# Patient Record
Sex: Female | Born: 2000 | Race: White | Hispanic: Yes | Marital: Single | Smoking: Never smoker
Health system: Southern US, Community
[De-identification: ages and names within clinical notes are randomized; demographics above are authoritative.]

## PROBLEM LIST (undated history)

## (undated) ENCOUNTER — Inpatient Hospital Stay (HOSPITAL_COMMUNITY): Payer: Self-pay

## (undated) DIAGNOSIS — J302 Other seasonal allergic rhinitis: Secondary | ICD-10-CM

## (undated) HISTORY — PX: TONSILLECTOMY: SUR1361

---
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
## 2018-06-30 ENCOUNTER — Other Ambulatory Visit: Payer: Self-pay | Admitting: Nurse Practitioner

## 2018-06-30 DIAGNOSIS — R1031 Right lower quadrant pain: Secondary | ICD-10-CM

## 2018-06-30 DIAGNOSIS — N946 Dysmenorrhea, unspecified: Secondary | ICD-10-CM

## 2020-11-18 ENCOUNTER — Ambulatory Visit
Admission: EM | Admit: 2020-11-18 | Discharge: 2020-11-18 | Disposition: A | Discharge status: Home / Self Care | Payer: Medicaid Other | Attending: Emergency Medicine | Admitting: Emergency Medicine

## 2020-11-18 DIAGNOSIS — R1030 Lower abdominal pain, unspecified: Secondary | ICD-10-CM

## 2020-11-18 DIAGNOSIS — M62838 Other muscle spasm: Secondary | ICD-10-CM

## 2020-11-18 MED ORDER — NAPROXEN 500 MG PO TABS
500.0000 mg | ORAL_TABLET | Freq: Two times a day (BID) | ORAL | 0 refills | Status: DC
Start: 2020-11-18 — End: 2024-08-09

## 2020-11-18 MED ORDER — TIZANIDINE HCL 2 MG PO TABS
2.0000 mg | ORAL_TABLET | Freq: Four times a day (QID) | ORAL | 0 refills | Status: DC | PRN
Start: 2020-11-18 — End: 2024-08-09

## 2020-11-18 NOTE — ED Triage Notes (Signed)
Pt c/o acute LLQ abdominal pain that radiates through left groin, down left leg, mild nausea and left face "twitching" for approx 3 days. States LLQ pain increases with "certain movements" Denies dysuria, fever, v/d, vaginal discharge/odor, URI symptoms.  Last BM was this morning WNL for pt.  Has IUD in place.  No OTC medications for symptoms.

## 2020-11-18 NOTE — Discharge Instructions (Signed)
Naprosyn twice daily for pain Supplement with tizanidine as needed for spasming-may cause drowsiness, do not drive or work after taking  Please continue monitor symptoms, follow-up if not improving or worsening

## 2020-11-18 NOTE — ED Provider Notes (Signed)
[facility]    CSN: [id number] Arrival date & time: 11/18/20  1219      History   Chief Complaint Chief Complaint  Patient presents with   Abdominal Pain    HPI Maureen Baxter is a 19 y.o. female presenting today for evaluation of abdominal pain and nausea.  Patient reports that she has had symptoms for approximately 3 days. Shocking sensation in lower abdomen,  Into anterior thigh. Left tingling sensation in face. Reports associated nausea. Has copper IUD in place. Mild weird cramping. Normal oral intake, Normal bowel movements.   HPI  Past Medical History:  Diagnosis Date   Seasonal allergies     There are no problems to display for this patient.   Past Surgical History:  Procedure Laterality Date   TONSILLECTOMY      OB History   No obstetric history on file.      Home Medications    Prior to Admission medications   Medication Sig Start Date End Date Taking? Authorizing Provider  acetaminophen (TYLENOL) 500 MG tablet Take 500 mg by mouth every 6 (six) hours as needed. For pain    [provider]  naproxen (NAPROSYN) 500 MG tablet Take 1 tablet (500 mg total) by mouth 2 (two) times daily. 11/18/20   Aliyah Abeyta C, PA-C  tiZANidine (ZANAFLEX) 2 MG tablet Take 1-2 tablets (2-4 mg total) by mouth every 6 (six) hours as needed for muscle spasms. 11/18/20   Kaito Schulenburg, Junius Creamer, PA-C    Family History Family History  Problem Relation Age of Onset   Healthy Father     Social History Social History   Tobacco Use   Smoking status: Never Smoker   Smokeless tobacco: Never Used  Building services engineer Use: Never used  Substance Use Topics   Alcohol use: Yes   Drug use: Yes    Types: Marijuana     Allergies   Patient has no known allergies.   Review of Systems Review of Systems  Constitutional: Negative for activity change, appetite change, chills, fatigue and fever.  HENT: Negative for congestion, ear pain, rhinorrhea,  sinus pressure, sore throat and trouble swallowing.   Eyes: Negative for discharge and redness.  Respiratory: Negative for cough, chest tightness and shortness of breath.   Cardiovascular: Negative for chest pain.  Gastrointestinal: Positive for abdominal pain and nausea. Negative for diarrhea and vomiting.  Genitourinary: Negative for dysuria, flank pain, genital sores, hematuria, menstrual problem, vaginal bleeding, vaginal discharge and vaginal pain.  Musculoskeletal: Positive for myalgias. Negative for back pain.  Skin: Negative for rash.  Neurological: Negative for dizziness, light-headedness and headaches.     Physical Exam Triage Vital Signs ED Triage Vitals  Enc Vitals Group     BP      Pulse      Resp      Temp      Temp src      SpO2      Weight      Height      Head Circumference      Peak Flow      Pain Score      Pain Loc      Pain Edu?      Excl. in GC?    No data found.  Updated Vital Signs BP (!) 133/91 (BP Location: Left Arm)    Pulse 87    Temp 98.5 F (36.9 C) (Oral)    Resp 16    LMP  11/10/2020 (Approximate)    SpO2 100%   Visual Acuity Right Eye Distance:   Left Eye Distance:   Bilateral Distance:    Right Eye Near:   Left Eye Near:    Bilateral Near:     Physical Exam Vitals and nursing note reviewed.  Constitutional:      Appearance: She is well-developed and well-nourished.     Comments: No acute distress  HENT:     Head: Normocephalic and atraumatic.     Nose: Nose normal.  Eyes:     Conjunctiva/sclera: Conjunctivae normal.  Cardiovascular:     Rate and Rhythm: Normal rate.  Pulmonary:     Effort: Pulmonary effort is normal. No respiratory distress.  Abdominal:     General: There is no distension.     Comments: Soft, nondistended, tender to palpation to bilateral lower quadrants, most prominent to suprapubic area and left lower quadrant  Genitourinary:    Comments: Vaginal mucosa pink, IUD strings visualized in cervical os,  mucousy discharge present around this Musculoskeletal:        General: Normal range of motion.     Cervical back: Neck supple.     Comments: Mild tenderness to palpation to left lower lumbar area, no midline tenderness Strength at hips and knees 5/5 ankle bilaterally, patellar reflex 2+ bilaterally  Skin:    General: Skin is warm and dry.  Neurological:     Mental Status: She is alert and oriented to person, place, and time.  Psychiatric:        Mood and Affect: Mood and affect normal.      UC Treatments / Results  Labs (all labs ordered are listed, but only abnormal results are displayed) Labs Reviewed - No data to display  EKG   Radiology No results found.  Procedures Procedures (including critical care time)  Medications Ordered in UC Medications - No data to display  Initial Impression / Assessment and Plan / UC Course  I have reviewed the triage vital signs and the nursing notes.  Pertinent labs & imaging results that were available during my care of the patient were reviewed by me and considered in my medical decision making (see chart for details).     Treating with Naprosyn and tizanidine given reported spasming sensations as well as possible relation to lower back pain.  IUD strings in place.  Did discuss potential follow-up with OB/GYN if continuing to have some lower abdominal/pelvic discomfort to further evaluate for underlying cyst versus abnormal placement of IUD.  No neuro deficits, strength and reflexes intact.  Discussed strict return precautions. Patient verbalized understanding and is agreeable with plan.  Final Clinical Impressions(s) / UC Diagnoses   Final diagnoses:  Lower abdominal pain  Muscle spasm     Discharge Instructions     Naprosyn twice daily for pain Supplement with tizanidine as needed for spasming-may cause drowsiness, do not drive or work after taking  Please continue monitor symptoms, follow-up if not improving or  worsening    ED Prescriptions    Medication Sig Dispense Auth. Provider   naproxen (NAPROSYN) 500 MG tablet Take 1 tablet (500 mg total) by mouth 2 (two) times daily. 30 tablet Sanya Kobrin C, PA-C   tiZANidine (ZANAFLEX) 2 MG tablet Take 1-2 tablets (2-4 mg total) by mouth every 6 (six) hours as needed for muscle spasms. 24 tablet Aniza Shor, Minto C, PA-C     PDMP not reviewed this encounter.   Lew Dawes, [place] 11/18/20 1512

## 2021-05-30 DIAGNOSIS — L309 Dermatitis, unspecified: Secondary | ICD-10-CM | POA: Insufficient documentation

## 2021-05-30 DIAGNOSIS — B353 Tinea pedis: Secondary | ICD-10-CM | POA: Insufficient documentation

## 2022-02-02 DIAGNOSIS — F4323 Adjustment disorder with mixed anxiety and depressed mood: Secondary | ICD-10-CM | POA: Insufficient documentation

## 2022-02-02 DIAGNOSIS — N943 Premenstrual tension syndrome: Secondary | ICD-10-CM | POA: Insufficient documentation

## 2022-07-17 DIAGNOSIS — R87610 Atypical squamous cells of undetermined significance on cytologic smear of cervix (ASC-US): Secondary | ICD-10-CM | POA: Insufficient documentation

## 2023-07-11 DIAGNOSIS — E611 Iron deficiency: Secondary | ICD-10-CM | POA: Insufficient documentation

## 2023-08-03 ENCOUNTER — Ambulatory Visit
Admission: EM | Admit: 2023-08-03 | Discharge: 2023-08-03 | Disposition: A | Discharge status: Home / Self Care | Payer: Medicaid Other | Attending: Nurse Practitioner | Admitting: Nurse Practitioner

## 2023-08-03 ENCOUNTER — Encounter: Payer: Self-pay | Admitting: Emergency Medicine

## 2023-08-03 ENCOUNTER — Other Ambulatory Visit: Payer: Self-pay

## 2023-08-03 DIAGNOSIS — Z1152 Encounter for screening for COVID-19: Secondary | ICD-10-CM | POA: Diagnosis present

## 2023-08-03 DIAGNOSIS — R062 Wheezing: Secondary | ICD-10-CM | POA: Insufficient documentation

## 2023-08-03 DIAGNOSIS — J069 Acute upper respiratory infection, unspecified: Secondary | ICD-10-CM | POA: Diagnosis present

## 2023-08-03 MED ORDER — ALBUTEROL SULFATE HFA 108 (90 BASE) MCG/ACT IN AERS: 1.0000 | INHALATION_SPRAY | Freq: Four times a day (QID) | RESPIRATORY_TRACT | 0 refills | Status: AC | PRN

## 2023-08-03 MED ORDER — BENZONATATE 100 MG PO CAPS
100.0000 mg | ORAL_CAPSULE | Freq: Three times a day (TID) | ORAL | 0 refills | Status: DC | PRN
Start: 2023-08-03 — End: 2024-08-09

## 2023-08-03 MED ORDER — PROMETHAZINE-DM 6.25-15 MG/5ML PO SYRP
5.0000 mL | ORAL_SOLUTION | Freq: Every evening | ORAL | 0 refills | Status: DC | PRN
Start: 2023-08-03 — End: 2024-08-09

## 2023-08-03 NOTE — Discharge Instructions (Signed)
You have a viral upper respiratory infection.  Symptoms should improve over the next week to 10 days.  If you develop chest pain or shortness of breath, go to the emergency room.  We have tested you today for COVID-19.  You will see the results in Mychart and we will contact you with positive results.  Please stay home and isolate until you are fever free without fever reducing medication for 24 hours.  You can use your albuterol inhaler every 4-6 hours as needed for wheezing or shortness of breath..    Some things that can make you feel better are: - Increased rest - Increasing fluid with water/sugar free electrolytes - Acetaminophen and ibuprofen as needed for fever/pain - Salt water gargling, chloraseptic spray and throat lozenges - OTC guaifenesin (Mucinex) 600 mg twice daily - Saline sinus flushes or a neti pot - Humidifying the air -Tessalon Perles every 8 hours as needed for dry cough and cough syrup at night time for dry cough

## 2023-08-03 NOTE — ED Notes (Signed)
Pt did not answer x 1 

## 2023-08-03 NOTE — ED Provider Notes (Signed)
[facility]    CSN: [id number] Arrival date & time: 08/03/23  7829      History   Chief Complaint Chief Complaint  Patient presents with   Nasal Congestion   Generalized Body Aches    HPI Maureen Baxter is a 22 y.o. female.   Patient presents today for 2-day history of bodyaches, chills, congested cough, wheezing, runny and stuffy nose, sore throat, headache, decreased appetite, and fatigue.  No fever, dry cough, chest tightness, ear pain, abdominal pain, nausea/vomiting, or diarrhea.  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  Has tried over-the-counter cold/flu medication without relief.  Reports she woke up this morning feeling much more fatigued.  Reports history of asthma, used to use rescue inhaler and does not have anymore.  Reports she no longer has IUD, LMP 07/13/2023.    Past Medical History:  Diagnosis Date   Seasonal allergies     There are no problems to display for this patient.   Past Surgical History:  Procedure Laterality Date   TONSILLECTOMY      OB History   No obstetric history on file.      Home Medications    Prior to Admission medications   Medication Sig Start Date End Date Taking? Authorizing Provider  albuterol (VENTOLIN HFA) 108 (90 Base) MCG/ACT inhaler Inhale 1-2 puffs into the lungs every 6 (six) hours as needed for wheezing or shortness of breath. 08/03/23  Yes Valentino Nose, NP  benzonatate (TESSALON) 100 MG capsule Take 1 capsule (100 mg total) by mouth 3 (three) times daily as needed for cough. Do not take with alcohol or while driving or operating heavy machinery.  May cause drowsiness. 08/03/23  Yes Valentino Nose, NP  promethazine-dextromethorphan (PROMETHAZINE-DM) 6.25-15 MG/5ML syrup Take 5 mLs by mouth at bedtime as needed for cough. Do not take with alcohol or while driving or operating heavy machinery.  May cause drowsiness. 08/03/23  Yes Valentino Nose, NP  acetaminophen  (TYLENOL) 500 MG tablet Take 500 mg by mouth every 6 (six) hours as needed. For pain    [provider]  naproxen (NAPROSYN) 500 MG tablet Take 1 tablet (500 mg total) by mouth 2 (two) times daily. 11/18/20   Wieters, Hallie C, PA-C  tiZANidine (ZANAFLEX) 2 MG tablet Take 1-2 tablets (2-4 mg total) by mouth every 6 (six) hours as needed for muscle spasms. 11/18/20   Wieters, Junius Creamer, PA-C    Family History Family History  Problem Relation Age of Onset   Healthy Father     Social History Social History   Tobacco Use   Smoking status: Never   Smokeless tobacco: Never  Vaping Use   Vaping status: Never Used  Substance Use Topics   Alcohol use: Yes   Drug use: Yes    Types: Marijuana     Allergies   Patient has no known allergies.   Review of Systems Review of Systems Per HPI  Physical Exam Triage Vital Signs ED Triage Vitals  Encounter Vitals Group     BP 08/03/23 1110 (!) 141/79     Systolic BP Percentile --      Diastolic BP Percentile --      Pulse Rate 08/03/23 1110 (!) 126     Resp 08/03/23 1110 18     Temp 08/03/23 1110 99.4 F (37.4 C)     Temp Source 08/03/23 1110 Oral     SpO2 08/03/23 1110 98 %  Weight --      Height --      Head Circumference --      Peak Flow --      Pain Score 08/03/23 1111 5     Pain Loc --      Pain Education --      Exclude from Growth Chart --    No data found.  Updated Vital Signs BP (!) 141/79 (BP Location: Left Arm)   Pulse (!) 126   Temp 99.4 F (37.4 C) (Oral)   Resp 18   SpO2 98%   Visual Acuity Right Eye Distance:   Left Eye Distance:   Bilateral Distance:    Right Eye Near:   Left Eye Near:    Bilateral Near:     Physical Exam Vitals and nursing note reviewed.  Constitutional:      General: She is not in acute distress.    Appearance: Normal appearance. She is ill-appearing. She is not toxic-appearing.  HENT:     Head: Normocephalic and atraumatic.     Right Ear: Tympanic membrane,  ear canal and external ear normal.     Left Ear: Tympanic membrane, ear canal and external ear normal.     Nose: Congestion and rhinorrhea present.     Mouth/Throat:     Mouth: Mucous membranes are moist.     Pharynx: Oropharynx is clear. Posterior oropharyngeal erythema present. No oropharyngeal exudate.  Eyes:     General: No scleral icterus.    Extraocular Movements: Extraocular movements intact.  Cardiovascular:     Rate and Rhythm: Regular rhythm. Tachycardia present.  Pulmonary:     Effort: Pulmonary effort is normal. No respiratory distress.     Breath sounds: Normal breath sounds. No wheezing, rhonchi or rales.  Abdominal:     General: Abdomen is flat. Bowel sounds are normal. There is no distension.     Palpations: Abdomen is soft.  Musculoskeletal:     Cervical back: Normal range of motion and neck supple.  Lymphadenopathy:     Cervical: No cervical adenopathy.  Skin:    General: Skin is warm and dry.     Coloration: Skin is not jaundiced or pale.     Findings: No erythema or rash.  Neurological:     Mental Status: She is alert and oriented to person, place, and time.  Psychiatric:        Behavior: Behavior is cooperative.      UC Treatments / Results  Labs (all labs ordered are listed, but only abnormal results are displayed) Labs Reviewed  SARS CORONAVIRUS 2 (TAT 6-24 HRS)    EKG   Radiology No results found.  Procedures Procedures (including critical care time)  Medications Ordered in UC Medications - No data to display  Initial Impression / Assessment and Plan / UC Course  I have reviewed the triage vital signs and the nursing notes.  Pertinent labs & imaging results that were available during my care of the patient were reviewed by me and considered in my medical decision making (see chart for details).   Patient is well-appearing, normotensive, afebrile, not tachypneic, oxygenating well on room air.  Patient is tachycardic in triage, likely  secondary to viral illness and low-grade fever.  1. Viral URI with cough 2. Encounter for screening for COVID-19 3. Wheezing Suspect viral etiology COVID-19 testing obtained Overall, vital signs are stable and examination is reassuring Influenza testing deferred given length of symptoms Supportive care discussed with patient Start cough  medication ER and return precautions also discussed Work excuse given  The patient was given the opportunity to ask questions.  All questions answered to their satisfaction.  The patient is in agreement to this plan.    Final Clinical Impressions(s) / UC Diagnoses   Final diagnoses:  Viral URI with cough  Encounter for screening for COVID-19  Wheezing     Discharge Instructions      You have a viral upper respiratory infection.  Symptoms should improve over the next week to 10 days.  If you develop chest pain or shortness of breath, go to the emergency room.  We have tested you today for COVID-19.  You will see the results in Mychart and we will contact you with positive results.  Please stay home and isolate until you are fever free without fever reducing medication for 24 hours.  You can use your albuterol inhaler every 4-6 hours as needed for wheezing or shortness of breath..    Some things that can make you feel better are: - Increased rest - Increasing fluid with water/sugar free electrolytes - Acetaminophen and ibuprofen as needed for fever/pain - Salt water gargling, chloraseptic spray and throat lozenges - OTC guaifenesin (Mucinex) 600 mg twice daily - Saline sinus flushes or a neti pot - Humidifying the air -Tessalon Perles every 8 hours as needed for dry cough and cough syrup at night time for dry cough     ED Prescriptions     Medication Sig Dispense Auth. Provider   albuterol (VENTOLIN HFA) 108 (90 Base) MCG/ACT inhaler Inhale 1-2 puffs into the lungs every 6 (six) hours as needed for wheezing or shortness of breath. 18 g  Cathlean Marseilles A, NP   benzonatate (TESSALON) 100 MG capsule Take 1 capsule (100 mg total) by mouth 3 (three) times daily as needed for cough. Do not take with alcohol or while driving or operating heavy machinery.  May cause drowsiness. 30 capsule Cathlean Marseilles A, NP   promethazine-dextromethorphan (PROMETHAZINE-DM) 6.25-15 MG/5ML syrup Take 5 mLs by mouth at bedtime as needed for cough. Do not take with alcohol or while driving or operating heavy machinery.  May cause drowsiness. 118 mL Valentino Nose, NP      PDMP not reviewed this encounter.   Valentino Nose, NP 08/03/23 [phone]

## 2023-08-03 NOTE — ED Triage Notes (Signed)
Pt here for body aches, cough and congestion x 3 days

## 2023-08-04 LAB — SARS CORONAVIRUS 2 (TAT 6-24 HRS): SARS Coronavirus 2: POSITIVE — AB

## 2023-11-02 ENCOUNTER — Ambulatory Visit
Admission: EM | Admit: 2023-11-02 | Discharge: 2023-11-02 | Disposition: A | Discharge status: Home / Self Care | Payer: Medicaid Other

## 2023-11-02 DIAGNOSIS — J019 Acute sinusitis, unspecified: Secondary | ICD-10-CM | POA: Diagnosis not present

## 2023-11-02 MED ORDER — AMOXICILLIN-POT CLAVULANATE 875-125 MG PO TABS: 1.0000 | ORAL_TABLET | Freq: Two times a day (BID) | ORAL | 0 refills | Status: DC

## 2023-11-02 NOTE — ED Provider Notes (Signed)
[facility]    CSN: [id number] Arrival date & time: 11/02/23  1032      History   Chief Complaint Chief Complaint  Patient presents with   Sore Throat   Fatigue   Generalized Body Aches    HPI Maureen Baxter is a 22 y.o. female.   Patient here today for evaluation of headache, fatigue, sore throat, fever that started about a week ago.  She notes that symptoms have not improved but her mucus is now green in color.  She denies any nausea or vomiting.  She does not have cough.  She has taken over-the-counter medication without resolution.  The history is provided by the patient.  Sore Throat Pertinent negatives include no abdominal pain and no shortness of breath.    Past Medical History:  Diagnosis Date   Seasonal allergies     Patient Active Problem List   Diagnosis Date Noted   Iron deficiency 07/11/2023   ASCUS of cervix with negative high risk HPV 07/17/2022   Adjustment disorder with mixed anxiety and depressed mood 02/02/2022   Premenstrual syndrome 02/02/2022   Dermatitis 05/30/2021   Tinea pedis of both feet 05/30/2021   Depression 09/13/2017   Generalized anxiety disorder 09/13/2017   Palpitations 08/27/2017    Past Surgical History:  Procedure Laterality Date   TONSILLECTOMY      OB History   No obstetric history on file.      Home Medications    Prior to Admission medications   Medication Sig Start Date End Date Taking? Authorizing Provider  amoxicillin-clavulanate (AUGMENTIN) 875-125 MG tablet Take 1 tablet by mouth every 12 (twelve) hours. 11/02/23  Yes Tomi Bamberger, PA-C  BALZIVA 0.4-35 MG-MCG tablet Take 1 tablet by mouth daily.   Yes [provider]  dextromethorphan-guaiFENesin (MUCINEX DM) 30-600 MG 12hr tablet Take 1 tablet by mouth 2 (two) times daily.   Yes [provider]  drospirenone-ethinyl estradiol (YAZ) 3-0.02 MG tablet Take 1 tablet by mouth daily. 07/17/22  Yes [provider]   acetaminophen (TYLENOL) 500 MG tablet Take 500 mg by mouth every 6 (six) hours as needed. For pain    [provider]  albuterol (VENTOLIN HFA) 108 (90 Base) MCG/ACT inhaler Inhale 1-2 puffs into the lungs every 6 (six) hours as needed for wheezing or shortness of breath. 08/03/23   Valentino Nose, NP  benzonatate (TESSALON) 100 MG capsule Take 1 capsule (100 mg total) by mouth 3 (three) times daily as needed for cough. Do not take with alcohol or while driving or operating heavy machinery.  May cause drowsiness. 08/03/23   Valentino Nose, NP  escitalopram (LEXAPRO) 20 MG tablet Take 20 mg by mouth daily.    [provider]  naproxen (NAPROSYN) 500 MG tablet Take 1 tablet (500 mg total) by mouth 2 (two) times daily. 11/18/20   Wieters, Hallie C, PA-C  promethazine-dextromethorphan (PROMETHAZINE-DM) 6.25-15 MG/5ML syrup Take 5 mLs by mouth at bedtime as needed for cough. Do not take with alcohol or while driving or operating heavy machinery.  May cause drowsiness. 08/03/23   Valentino Nose, NP  tiZANidine (ZANAFLEX) 2 MG tablet Take 1-2 tablets (2-4 mg total) by mouth every 6 (six) hours as needed for muscle spasms. 11/18/20   Wieters, Junius Creamer, PA-C    Family History Family History  Problem Relation Age of Onset   Healthy Father     Social History Social History   Tobacco Use   Smoking status:  Never   Smokeless tobacco: Never  Vaping Use   Vaping status: Never Used  Substance Use Topics   Alcohol use: Yes    Comment: Occassionally.   Drug use: Not Currently    Types: Marijuana     Allergies   Latex   Review of Systems Review of Systems  Constitutional:  Negative for chills and fever.  HENT:  Positive for congestion, sinus pressure and sore throat. Negative for ear pain.   Eyes:  Negative for discharge and redness.  Respiratory:  Negative for cough, shortness of breath and wheezing.   Gastrointestinal:  Negative for abdominal pain, diarrhea, nausea  and vomiting.     Physical Exam Triage Vital Signs ED Triage Vitals  Encounter Vitals Group     BP 11/02/23 1046 105/71     Systolic BP Percentile --      Diastolic BP Percentile --      Pulse Rate 11/02/23 1046 89     Resp 11/02/23 1046 18     Temp 11/02/23 1046 98.5 F (36.9 C)     Temp Source 11/02/23 1046 Oral     SpO2 11/02/23 1046 100 %     Weight 11/02/23 1044 130 lb (59 kg)     Height 11/02/23 1044 5' (1.524 m)     Head Circumference --      Peak Flow --      Pain Score 11/02/23 1042 0     Pain Loc --      Pain Education --      Exclude from Growth Chart --    No data found.  Updated Vital Signs BP 105/71 (BP Location: Left Arm)   Pulse 89   Temp 98.5 F (36.9 C) (Oral)   Resp 18   Ht 5' (1.524 m)   Wt 130 lb (59 kg)   LMP  (LMP Unknown)   SpO2 100%   BMI 25.39 kg/m   Visual Acuity Right Eye Distance:   Left Eye Distance:   Bilateral Distance:    Right Eye Near:   Left Eye Near:    Bilateral Near:     Physical Exam Vitals and nursing note reviewed.  Constitutional:      General: She is not in acute distress.    Appearance: Normal appearance. She is not ill-appearing.  HENT:     Head: Normocephalic and atraumatic.     Nose: Congestion present.     Mouth/Throat:     Mouth: Mucous membranes are moist.     Pharynx: No oropharyngeal exudate or posterior oropharyngeal erythema.  Eyes:     Conjunctiva/sclera: Conjunctivae normal.  Cardiovascular:     Rate and Rhythm: Normal rate and regular rhythm.     Heart sounds: Normal heart sounds. No murmur heard. Pulmonary:     Effort: Pulmonary effort is normal. No respiratory distress.     Breath sounds: Normal breath sounds. No wheezing, rhonchi or rales.  Skin:    General: Skin is warm and dry.  Neurological:     Mental Status: She is alert.  Psychiatric:        Mood and Affect: Mood normal.        Thought Content: Thought content normal.      UC Treatments / Results  Labs (all labs ordered  are listed, but only abnormal results are displayed) Labs Reviewed - No data to display  EKG   Radiology No results found.  Procedures Procedures (including critical care time)  Medications Ordered in  UC Medications - No data to display  Initial Impression / Assessment and Plan / UC Course  I have reviewed the triage vital signs and the nursing notes.  Pertinent labs & imaging results that were available during my care of the patient were reviewed by me and considered in my medical decision making (see chart for details).    Will treat to cover sinusitis with Augmentin.  Recommended follow-up if no gradual improvement or with any further concerns.  Final Clinical Impressions(s) / UC Diagnoses   Final diagnoses:  Acute sinusitis, recurrence not specified, unspecified location   Discharge Instructions   None    ED Prescriptions     Medication Sig Dispense Auth. Provider   amoxicillin-clavulanate (AUGMENTIN) 875-125 MG tablet Take 1 tablet by mouth every 12 (twelve) hours. 14 tablet Tomi Bamberger, PA-C      PDMP not reviewed this encounter.   Tomi Bamberger, PA-C 11/02/23 1331

## 2023-11-02 NOTE — ED Triage Notes (Signed)
"  This started with headache, fatigue, sore throat and fever blister exactly one week ago". "This is unchanged now with congestion, green mucous". No fever known "maybe the 1st day". No nausea. No vomiting. No new/unexplained rash. No Cough, No sob.

## 2024-08-08 DIAGNOSIS — F1220 Cannabis dependence, uncomplicated: Secondary | ICD-10-CM | POA: Diagnosis not present

## 2024-08-08 DIAGNOSIS — F332 Major depressive disorder, recurrent severe without psychotic features: Secondary | ICD-10-CM | POA: Diagnosis not present

## 2024-08-08 DIAGNOSIS — F4310 Post-traumatic stress disorder, unspecified: Secondary | ICD-10-CM | POA: Diagnosis not present

## 2024-08-08 DIAGNOSIS — F1020 Alcohol dependence, uncomplicated: Secondary | ICD-10-CM | POA: Diagnosis not present

## 2024-08-09 ENCOUNTER — Ambulatory Visit
Admission: EM | Admit: 2024-08-09 | Discharge: 2024-08-09 | Disposition: A | Discharge status: Home / Self Care | Attending: Nurse Practitioner | Admitting: Nurse Practitioner

## 2024-08-09 ENCOUNTER — Encounter: Payer: Self-pay | Admitting: Emergency Medicine

## 2024-08-09 DIAGNOSIS — J069 Acute upper respiratory infection, unspecified: Secondary | ICD-10-CM

## 2024-08-09 LAB — POC SARS CORONAVIRUS 2 AG -  ED: SARS Coronavirus 2 Ag: NEGATIVE

## 2024-08-09 NOTE — Discharge Instructions (Addendum)
 Your symptoms are most likely caused by a respiratory infection, which affects areas like your nose, throat, or lungs. This type of infection is usually caused by a virus.  Since your illness is caused by a virus, antibiotics won't help because they only treat infections caused by bacteria.  Continue the medications that you have been taking for symptom relief.  If you have a fever, headache, or body aches, you can also take Tylenol or ibuprofen to help you feel more comfortable. Be sure to drink plenty of fluids to stay hydrated--aim for enough to keep your urine a pale yellow color. This will also help to thin mucus and make it easier to clear from your body.   Using a cool mist humidifier at home to keep humidity levels above 50% can be helpful. You can also inhale steam for 10 to 15 minutes, 3 to 4 times a day. This can be done by sitting in the bathroom with a hot shower running, or by using over-the-counter vapor shower tablets to help with nasal congestion. Try to avoid cool or dry air as much as possible. When you sleep, keep your head elevated to help reduce post-nasal drainage. Be sure to get enough rest every night to support your recovery. Don't forget to replace your toothbrush once you start feeling better.   It's normal for a cough to linger for several weeks after a respiratory illness, even after other symptoms have resolved. This happens because the airways remain irritated and take time to fully heal. As long as the cough gradually improves and there are no new concerning symptoms, this is part of the normal recovery process.  If your symptoms get worse or if you develop any new or concerning symptoms, go to the emergency room right away. If you're not feeling better in a few days, follow up with your primary care provider.

## 2024-08-09 NOTE — ED Triage Notes (Signed)
 Pt st's she started feeling bad 4 days ago with generalized body aches, fever, productive cough (yellow) and vomiting yesterday

## 2024-08-09 NOTE — ED Provider Notes (Signed)
 [facility]    CSN: [id number] Arrival date & time: 08/09/24  1431      History   Chief Complaint Chief Complaint  Patient presents with   Generalized Body Aches    HPI Maureen Baxter is a 23 y.o. female.   Discussed the use of AI scribe software for clinical note transcription with the patient, who gave verbal consent to proceed.   The patient presents with multiple symptoms including body aches, nasal congestion, sneezing, cough, sore throat, runny nose, chills, vomiting, and headaches that started about 4 days ago. The patient denies fever, diarrhea, shortness of breath, or wheezing.   The patient has been taking over-the-counter medication containing pain relief, expectorant, and antihistamine components, which has provided some relief. The patient reports no known exposure to sick individuals. COVID tests performed yesterday at home were negative.   The following sections of the patient's history were reviewed and updated as appropriate: allergies, current medications, past family history, past medical history, past social history, past surgical history, and problem list.        Past Medical History:  Diagnosis Date   Seasonal allergies     Patient Active Problem List   Diagnosis Date Noted   Iron deficiency 07/11/2023   ASCUS of cervix with negative high risk HPV 07/17/2022   Adjustment disorder with mixed anxiety and depressed mood 02/02/2022   Premenstrual syndrome 02/02/2022   Dermatitis 05/30/2021   Tinea pedis of both feet 05/30/2021   Depression 09/13/2017   Generalized anxiety disorder 09/13/2017   Palpitations 08/27/2017    Past Surgical History:  Procedure Laterality Date   TONSILLECTOMY      OB History   No obstetric history on file.      Home Medications    Prior to Admission medications   Medication Sig Start Date End Date Taking? Authorizing Provider  acetaminophen (TYLENOL) 500 MG tablet Take 500 mg by mouth every 6  (six) hours as needed. For pain    [provider]  albuterol  (VENTOLIN  HFA) 108 (90 Base) MCG/ACT inhaler Inhale 1-2 puffs into the lungs every 6 (six) hours as needed for wheezing or shortness of breath. 08/03/23   Chandra Harlene LABOR, NP  BALZIVA 0.4-35 MG-MCG tablet Take 1 tablet by mouth daily.    [provider]  drospirenone-ethinyl estradiol (YAZ) 3-0.02 MG tablet Take 1 tablet by mouth daily. 07/17/22   [provider]  escitalopram (LEXAPRO) 20 MG tablet Take 20 mg by mouth daily.    [provider]    Family History Family History  Problem Relation Age of Onset   Healthy Father     Social History Social History   Tobacco Use   Smoking status: Never   Smokeless tobacco: Never  Vaping Use   Vaping status: Never Used  Substance Use Topics   Alcohol use: Yes    Comment: Occassionally.   Drug use: Not Currently    Types: Marijuana     Allergies   Latex   Review of Systems Review of Systems  Constitutional:  Positive for chills. Negative for fever.  HENT:  Positive for congestion, rhinorrhea, sneezing and sore throat.   Respiratory:  Positive for cough. Negative for shortness of breath and wheezing.   Gastrointestinal:  Positive for vomiting. Negative for diarrhea.  Musculoskeletal:  Positive for myalgias.  Neurological:  Positive for headaches.  All other systems reviewed and are negative.    Physical Exam Triage Vital Signs ED Triage Vitals  Encounter Vitals  Group     BP --      Girls Systolic BP Percentile --      Girls Diastolic BP Percentile --      Boys Systolic BP Percentile --      Boys Diastolic BP Percentile --      Pulse Rate 08/09/24 1501 (!) 101     Resp 08/09/24 1501 16     Temp 08/09/24 1501 98.3 F (36.8 C)     Temp Source 08/09/24 1501 Oral     SpO2 08/09/24 1501 97 %     Weight --      Height --      Head Circumference --      Peak Flow --      Pain Score 08/09/24 1502 7     Pain Loc --      Pain  Education --      Exclude from Growth Chart --    No data found.  Updated Vital Signs Pulse (!) 101   Temp 98.3 F (36.8 C) (Oral)   Resp 16   LMP 07/18/2024 (Exact Date)   SpO2 97%   Visual Acuity Right Eye Distance:   Left Eye Distance:   Bilateral Distance:    Right Eye Near:   Left Eye Near:    Bilateral Near:     Physical Exam Vitals reviewed.  Constitutional:      General: She is awake. She is not in acute distress.    Appearance: Normal appearance. She is well-developed. She is not ill-appearing, toxic-appearing or diaphoretic.  HENT:     Head: Normocephalic.     Right Ear: Tympanic membrane, ear canal and external ear normal. No drainage, swelling or tenderness. No middle ear effusion. Tympanic membrane is not erythematous.     Left Ear: Tympanic membrane, ear canal and external ear normal. No drainage, swelling or tenderness.  No middle ear effusion. Tympanic membrane is not erythematous.     Nose: Congestion present. No rhinorrhea.     Mouth/Throat:     Lips: Pink.     Mouth: Mucous membranes are moist.     Pharynx: No pharyngeal swelling, oropharyngeal exudate, posterior oropharyngeal erythema or uvula swelling.     Tonsils: No tonsillar exudate or tonsillar abscesses.  Eyes:     General: Vision grossly intact.     Conjunctiva/sclera: Conjunctivae normal.  Cardiovascular:     Rate and Rhythm: Normal rate.     Heart sounds: Normal heart sounds.  Pulmonary:     Effort: Pulmonary effort is normal. No tachypnea or respiratory distress.     Breath sounds: Normal breath sounds and air entry.     Comments: Respirations even and unlabored  Musculoskeletal:        General: Normal range of motion.     Cervical back: Normal range of motion and neck supple.  Lymphadenopathy:     Cervical: No cervical adenopathy.  Skin:    General: Skin is warm and dry.  Neurological:     General: No focal deficit present.     Mental Status: She is alert and oriented to person,  place, and time.  Psychiatric:        Behavior: Behavior is cooperative.      UC Treatments / Results  Labs (all labs ordered are listed, but only abnormal results are displayed) Labs Reviewed  POC SARS CORONAVIRUS 2 AG -  ED    EKG   Radiology No results found.  Procedures Procedures (including critical  care time)  Medications Ordered in UC Medications - No data to display  Initial Impression / Assessment and Plan / UC Course  I have reviewed the triage vital signs and the nursing notes.  Pertinent labs & imaging results that were available during my care of the patient were reviewed by me and considered in my medical decision making (see chart for details).     The patient presents with symptoms consistent with a viral upper respiratory infection. Home COVID test yesterday and COVID test here today were both negative. Exam is reassuring and no evidence of bacterial infection or acute cardiopulmonary process is noted. Supportive care is recommended. Patient was advised to follow up with primary care if symptoms do not improve within one week or if new concerns arise. Instructions were given to seek emergency care if symptoms worsen, including shortness of breath, chest pain, persistent high fever, inability to tolerate fluids, or confusion.  Today's evaluation has revealed no signs of a dangerous process. Discussed diagnosis with patient and/or guardian. Patient and/or guardian aware of their diagnosis, possible red flag symptoms to watch out for and need for close follow up. Patient and/or guardian understands verbal and written discharge instructions. Patient and/or guardian comfortable with plan and disposition.  Patient and/or guardian has a clear mental status at this time, good insight into illness (after discussion and teaching) and has clear judgment to make decisions regarding their care  Documentation was completed with the aid of voice recognition software.  Transcription may contain typographical errors.  Final Clinical Impressions(s) / UC Diagnoses   Final diagnoses:  Viral upper respiratory tract infection     Discharge Instructions      Your symptoms are most likely caused by a respiratory infection, which affects areas like your nose, throat, or lungs. This type of infection is usually caused by a virus.  Since your illness is caused by a virus, antibiotics won't help because they only treat infections caused by bacteria.  Continue the medications that you have been taking for symptom relief.  If you have a fever, headache, or body aches, you can also take Tylenol or ibuprofen to help you feel more comfortable. Be sure to drink plenty of fluids to stay hydrated--aim for enough to keep your urine a pale yellow color. This will also help to thin mucus and make it easier to clear from your body.   Using a cool mist humidifier at home to keep humidity levels above 50% can be helpful. You can also inhale steam for 10 to 15 minutes, 3 to 4 times a day. This can be done by sitting in the bathroom with a hot shower running, or by using over-the-counter vapor shower tablets to help with nasal congestion. Try to avoid cool or dry air as much as possible. When you sleep, keep your head elevated to help reduce post-nasal drainage. Be sure to get enough rest every night to support your recovery. Don't forget to replace your toothbrush once you start feeling better.   It's normal for a cough to linger for several weeks after a respiratory illness, even after other symptoms have resolved. This happens because the airways remain irritated and take time to fully heal. As long as the cough gradually improves and there are no new concerning symptoms, this is part of the normal recovery process.  If your symptoms get worse or if you develop any new or concerning symptoms, go to the emergency room right away. If you're not feeling better in  a few days, follow up  with your primary care provider.            ED Prescriptions   None    PDMP not reviewed this encounter.   Iola Lukes, [place] 08/09/24 [phone]

## 2024-09-14 ENCOUNTER — Ambulatory Visit (INDEPENDENT_AMBULATORY_CARE_PROVIDER_SITE_OTHER): Payer: Self-pay | Admitting: Adult Health

## 2024-09-14 ENCOUNTER — Other Ambulatory Visit: Payer: Self-pay

## 2024-09-14 ENCOUNTER — Encounter: Payer: Self-pay | Admitting: Adult Health

## 2024-09-14 VITALS — BP 120/74 | HR 98 | Temp 98.1°F | Ht 60.0 in | Wt 127.0 lb

## 2024-09-14 DIAGNOSIS — Z3490 Encounter for supervision of normal pregnancy, unspecified, unspecified trimester: Secondary | ICD-10-CM

## 2024-09-14 NOTE — Progress Notes (Signed)
 [facility] [address] [place], [place] [address]   Office Visit Note  Patient Name: Maureen Baxter Date of Birth [birth year]  Medical Record number [id number]  Date of Service: 09/14/2024  Chief Complaint  Patient presents with   Acute Visit    Patient took a home pregnancy test which she states was positive and would like to get lab work for confirmation.     HPI Pt is here for a sick visit. She reports she has been feeling off.  Her LMP was 8 weeks ago.  Right after that she started a new birth control pack.  She had not been taking it for a few months because she didn't have a partner.  She bled for 2 weeks after starting the OCP.  She finally stopped because she was concerned. She took two early pregnancy test that were faintly positive.  She took a normal pregnancy test last night that was positive.  She wanted to discuss a blood test for pregnancy, she is looking to see how far along she is.     Current Medication:  Outpatient Encounter Medications as of 09/14/2024  Medication Sig   acetaminophen (TYLENOL) 500 MG tablet Take 500 mg by mouth every 6 (six) hours as needed. For pain   albuterol  (VENTOLIN  HFA) 108 (90 Base) MCG/ACT inhaler Inhale 1-2 puffs into the lungs every 6 (six) hours as needed for wheezing or shortness of breath.   FLUoxetine (PROZAC) 40 MG capsule Take 40 mg by mouth every morning.   [DISCONTINUED] BALZIVA 0.4-35 MG-MCG tablet Take 1 tablet by mouth daily.   [DISCONTINUED] drospirenone-ethinyl estradiol (YAZ) 3-0.02 MG tablet Take 1 tablet by mouth daily.   [DISCONTINUED] escitalopram (LEXAPRO) 20 MG tablet Take 20 mg by mouth daily.   No facility-administered encounter medications on file as of 09/14/2024.      Medical History: Past Medical History:  Diagnosis Date   Seasonal allergies      Vital Signs: BP 120/74   Pulse 98   Temp 98.1 F (36.7 C)   Ht 5' (1.524 m)   Wt 127 lb (57.6 kg)   SpO2 99%   BMI 24.80 kg/m     Review of Systems  Constitutional:  Positive for fatigue. Negative for chills and fever.  Gastrointestinal:  Positive for nausea.  Psychiatric/Behavioral:  The patient is nervous/anxious.     Physical Exam Vitals reviewed.  Constitutional:      Appearance: Normal appearance.  Neurological:     Mental Status: She is alert.  Psychiatric:        Mood and Affect: Mood normal.        Behavior: Behavior normal.        Thought Content: Thought content normal.    Assessment/Plan: 1. Pregnancy, unspecified gestational age (Primary) Pt has a GYN and is encouraged to call today for appt to discuss options. Offered urine and Beta HCG test in office, discussed what information that would give us , and she declined.  She will follow up with gyn.      General Counseling: Maureen Baxter verbalizes understanding of the findings of todays visit and agrees with plan of treatment. I have discussed any further diagnostic evaluation that may be needed or ordered today. We also reviewed her medications today. she has been encouraged to call the office with any questions or concerns that should arise related to todays visit.   No orders of the defined types were placed in this encounter.   No orders of the  defined types were placed in this encounter.   Time spent:20 Minutes    Juliene DOROTHA Howells AGNP-C Nurse Practitioner

## 2024-09-20 ENCOUNTER — Other Ambulatory Visit: Payer: Self-pay

## 2024-09-20 ENCOUNTER — Inpatient Hospital Stay (HOSPITAL_COMMUNITY)
Admission: AD | Admit: 2024-09-20 | Discharge: 2024-09-21 | Disposition: A | Discharge status: Home / Self Care | Attending: Obstetrics and Gynecology | Admitting: Obstetrics and Gynecology

## 2024-09-20 DIAGNOSIS — O209 Hemorrhage in early pregnancy, unspecified: Secondary | ICD-10-CM | POA: Diagnosis not present

## 2024-09-20 DIAGNOSIS — R7989 Other specified abnormal findings of blood chemistry: Secondary | ICD-10-CM | POA: Diagnosis not present

## 2024-09-20 DIAGNOSIS — Z3A00 Weeks of gestation of pregnancy not specified: Secondary | ICD-10-CM | POA: Diagnosis not present

## 2024-09-20 DIAGNOSIS — O3680X0 Pregnancy with inconclusive fetal viability, not applicable or unspecified: Secondary | ICD-10-CM | POA: Insufficient documentation

## 2024-09-20 DIAGNOSIS — N939 Abnormal uterine and vaginal bleeding, unspecified: Secondary | ICD-10-CM | POA: Diagnosis not present

## 2024-09-20 LAB — URINALYSIS, ROUTINE W REFLEX MICROSCOPIC
Bilirubin Urine: NEGATIVE
Glucose, UA: NEGATIVE mg/dL
Ketones, ur: NEGATIVE mg/dL
Leukocytes,Ua: NEGATIVE
Nitrite: NEGATIVE
Protein, ur: NEGATIVE mg/dL
Specific Gravity, Urine: 1.011 (ref 1.005–1.030)
pH: 8 (ref 5.0–8.0)

## 2024-09-20 LAB — CBC
HCT: 37 % (ref 36.0–46.0)
Hemoglobin: 12.2 g/dL (ref 12.0–15.0)
MCH: 30.7 pg (ref 26.0–34.0)
MCHC: 33 g/dL (ref 30.0–36.0)
MCV: 93 fL (ref 80.0–100.0)
Platelets: 264 K/uL (ref 150–400)
RBC: 3.98 MIL/uL (ref 3.87–5.11)
RDW: 12.4 % (ref 11.5–15.5)
WBC: 9.7 K/uL (ref 4.0–10.5)
nRBC: 0 % (ref 0.0–0.2)

## 2024-09-20 LAB — POCT PREGNANCY, URINE: Preg Test, Ur: NEGATIVE

## 2024-09-20 NOTE — MAU Provider Note (Signed)
 Chief Complaint:  Abdominal Pain and Vaginal Bleeding   HPI   Event Date/Time   First Provider Initiated Contact with Patient 09/20/24 2313      Maureen Baxter is a 23 y.o. No obstetric history on file. at Unknown who presents to maternity admissions reporting ***. Had a positive home urine pregnancy test over a week ago with a second positive urine this past Sunday. On Friday, she went to a pregnancy center and had a positive urine test there as well. Today, she started having cramping. Went to the bathroom and noticed light pink spotting when wiping. Two hours prior to presenting, she passed 1 quarter-sized blood clot and blood mixed with mucus. While in the MAU, when she went to the bathroom to do a pregnancy test, she also noticed blood mixed with mucus.    Past Medical History:  Diagnosis Date   Seasonal allergies    OB History  No obstetric history on file.   Past Surgical History:  Procedure Laterality Date   TONSILLECTOMY     Family History  Problem Relation Age of Onset   Healthy Father    Social History   Tobacco Use   Smoking status: Never   Smokeless tobacco: Never  Vaping Use   Vaping status: Never Used  Substance Use Topics   Alcohol use: Yes    Comment: Occassionally.   Drug use: Not Currently    Types: Marijuana   Allergies  Allergen Reactions   Latex Dermatitis   Medications Prior to Admission  Medication Sig Dispense Refill Last Dose/Taking   acetaminophen (TYLENOL) 500 MG tablet Take 500 mg by mouth every 6 (six) hours as needed. For pain      albuterol  (VENTOLIN  HFA) 108 (90 Base) MCG/ACT inhaler Inhale 1-2 puffs into the lungs every 6 (six) hours as needed for wheezing or shortness of breath. 18 g 0    FLUoxetine (PROZAC) 40 MG capsule Take 40 mg by mouth every morning.       I have reviewed patient's Past Medical Hx, Surgical Hx, Family Hx, Social Hx, medications and allergies.   ROS  Pertinent items noted in HPI and remainder of comprehensive  ROS otherwise negative.   PHYSICAL EXAM  Patient Vitals for the past 24 hrs:  BP Temp Temp src Pulse Resp SpO2 Height Weight  09/20/24 2209 (!) 140/91 99 F (37.2 C) Oral 80 18 100 % 5' (1.524 m) 59.9 kg    Constitutional: Well-developed, well-nourished female in no acute distress.  Cardiovascular: normal rate & rhythm, warm and well-perfused Respiratory: normal effort, no problems with respiration noted GI: Abd soft, non-tender, non-distended MS: Extremities nontender, no edema, normal ROM Neurologic: Alert and oriented x 4.  GU: no CVA tenderness Pelvic: Deferred     Fetal Tracing: Baseline: Variability: Accelerations:  Decelerations: Toco:    Labs: Results for orders placed or performed during the hospital encounter of 09/20/24 (from the past 24 hours)  Urinalysis, Routine w reflex microscopic -Urine, Clean Catch     Status: Abnormal   Collection Time: 09/20/24 10:15 PM  Result Value Ref Range   Color, Urine YELLOW YELLOW   APPearance CLEAR CLEAR   Specific Gravity, Urine 1.011 1.005 - 1.030   pH 8.0 5.0 - 8.0   Glucose, UA NEGATIVE NEGATIVE mg/dL   Hgb urine dipstick MODERATE (A) NEGATIVE   Bilirubin Urine NEGATIVE NEGATIVE   Ketones, ur NEGATIVE NEGATIVE mg/dL   Protein, ur NEGATIVE NEGATIVE mg/dL   Nitrite NEGATIVE NEGATIVE   Leukocytes,Ua  NEGATIVE NEGATIVE   RBC / HPF 0-5 0 - 5 RBC/hpf   WBC, UA 0-5 0 - 5 WBC/hpf   Bacteria, UA RARE (A) NONE SEEN   Squamous Epithelial / HPF 0-5 0 - 5 /HPF  Pregnancy, urine POC     Status: None   Collection Time: 09/20/24 10:17 PM  Result Value Ref Range   Preg Test, Ur NEGATIVE NEGATIVE    Imaging:  No results found.  MDM & MAU COURSE  MDM: {UDFIF:66548}  MAU Course: Orders Placed This Encounter  Procedures   Urinalysis, Routine w reflex microscopic -Urine, Clean Catch   hCG, quantitative, pregnancy   CBC   Comprehensive metabolic panel with GFR   Pregnancy, urine POC   ABO/Rh   No orders of the defined types  were placed in this encounter.  *** ASSESSMENT  No diagnosis found.  PLAN  Discharge home in stable condition with return precautions.  ***    Allergies as of 09/20/2024       Reactions   Latex Dermatitis     Med Rec must be completed prior to using this [facility]***       Charlie Courts, MD  Family Medicine - Obstetrics Fellow

## 2024-09-20 NOTE — MAU Note (Signed)
 Maureen Baxter is a 23 y.o. at Unknown here in MAU reporting: last week had positive home tests - had US  at pregnancy care center but doesn't think they were able to see baby yet. Today started having cramping and noticed light pink spotting when she wiped. Cramps have worsened throughout the day and noticed a large blood clot in the toilet about an hour ago.   LMP: 07/19/2024 Pain score: 5 Vitals:   09/20/24 2209  BP: (!) 140/91  Pulse: 80  Resp: 18  Temp: 99 F (37.2 C)  SpO2: 100%     FHT: NA  Lab orders placed from triage: urine pregnancy; UA

## 2024-09-21 ENCOUNTER — Inpatient Hospital Stay (HOSPITAL_COMMUNITY)

## 2024-09-21 DIAGNOSIS — O209 Hemorrhage in early pregnancy, unspecified: Secondary | ICD-10-CM | POA: Diagnosis not present

## 2024-09-21 DIAGNOSIS — Z3A00 Weeks of gestation of pregnancy not specified: Secondary | ICD-10-CM

## 2024-09-21 DIAGNOSIS — O3680X0 Pregnancy with inconclusive fetal viability, not applicable or unspecified: Secondary | ICD-10-CM | POA: Diagnosis not present

## 2024-09-21 DIAGNOSIS — R7989 Other specified abnormal findings of blood chemistry: Secondary | ICD-10-CM | POA: Diagnosis not present

## 2024-09-21 LAB — COMPREHENSIVE METABOLIC PANEL WITH GFR
ALT: 25 U/L (ref 0–44)
AST: 21 U/L (ref 15–41)
Albumin: 4.1 g/dL (ref 3.5–5.0)
Alkaline Phosphatase: 58 U/L (ref 38–126)
Anion gap: 11 (ref 5–15)
BUN: 10 mg/dL (ref 6–20)
CO2: 21 mmol/L — ABNORMAL LOW (ref 22–32)
Calcium: 9 mg/dL (ref 8.9–10.3)
Chloride: 105 mmol/L (ref 98–111)
Creatinine, Ser: 0.63 mg/dL (ref 0.44–1.00)
GFR, Estimated: >60 mL/min (ref >60)
Glucose, Bld: 102 mg/dL — ABNORMAL HIGH (ref 70–99)
Potassium: 3.3 mmol/L — ABNORMAL LOW (ref 3.5–5.1)
Sodium: 137 mmol/L (ref 135–145)
Total Bilirubin: 0.4 mg/dL (ref 0.0–1.2)
Total Protein: 7.1 g/dL (ref 6.5–8.1)

## 2024-09-21 LAB — ABO/RH: ABO/RH(D): A POS

## 2024-09-21 LAB — HCG, QUANTITATIVE, PREGNANCY: hCG, Beta Chain, Quant, S: 54 m[IU]/mL — ABNORMAL HIGH (ref <5)

## 2024-09-21 NOTE — Discharge Instructions (Addendum)
 You came into the MAU because you had positive pregnancy tests at home and then an episode of bleeding. Your urine pregnancy test was negative, but the blood test was elevated. We also did an ultrasound which did not show a pregnancy in the uterus. Please come back to the MAU on Saturday, 10/25 so we can repeat the blood test.

## 2024-09-23 ENCOUNTER — Other Ambulatory Visit: Payer: Self-pay

## 2024-09-23 ENCOUNTER — Encounter: Payer: Self-pay | Admitting: Certified Nurse Midwife

## 2024-09-23 ENCOUNTER — Inpatient Hospital Stay (HOSPITAL_COMMUNITY)
Admission: AD | Admit: 2024-09-23 | Discharge: 2024-09-23 | Disposition: A | Discharge status: Home / Self Care | Attending: Obstetrics and Gynecology | Admitting: Obstetrics and Gynecology

## 2024-09-23 ENCOUNTER — Other Ambulatory Visit: Payer: Self-pay | Admitting: Certified Nurse Midwife

## 2024-09-23 DIAGNOSIS — O039 Complete or unspecified spontaneous abortion without complication: Secondary | ICD-10-CM

## 2024-09-23 DIAGNOSIS — Z3A09 9 weeks gestation of pregnancy: Secondary | ICD-10-CM | POA: Diagnosis not present

## 2024-09-23 LAB — HCG, QUANTITATIVE, PREGNANCY: hCG, Beta Chain, Quant, S: 20 m[IU]/mL — ABNORMAL HIGH (ref <5)

## 2024-09-23 NOTE — Discharge Instructions (Signed)
 Maureen Baxter,  I am so sorry for your loss. Your pregnancy hormone level is decreasing appropriately, and we  would like to follow it on Monday to make sure it's continuing to decrease.   Please return to MAU with heavy bleeding (filling a pad in an hour or less, passing clots the size of a chicken's egg or larger) or signs of infection (fever (temp 100.4 or greater) or foul smelling bleeding, chills) or other concerns.   Thank you for trusting us  to care for you, [name], Midwife

## 2024-09-23 NOTE — MAU Provider Note (Signed)
 None     S Ms. Maureen Baxter is a 23 y.o. G1P0 non-pregnant female at [redacted]w[redacted]d who presents to MAU today with complaint of follow up beta Hcg after probable miscarriage. She reports heavy bleeding with passage of clots and tissue the past two days, with decrease in bleeding to light and minimal pain today.   She has not yet established care this pregnancy.   Pertinent items noted in HPI and remainder of comprehensive ROS otherwise negative.   O BP 128/75 (BP Location: Right Arm)   Pulse 77   Temp 98.7 F (37.1 C) (Oral)   Resp 18   Ht 5' (1.524 m)   Wt 58.1 kg   LMP 07/19/2024 (Approximate)   SpO2 100%   BMI 25.02 kg/m  Physical Exam Vitals and nursing note reviewed.  Constitutional:      General: She is not in acute distress.    Appearance: Normal appearance. She is not ill-appearing, toxic-appearing or diaphoretic.  HENT:     Head: Normocephalic.  Cardiovascular:     Rate and Rhythm: Normal rate and regular rhythm.     Pulses: Normal pulses.     Heart sounds: Normal heart sounds.  Pulmonary:     Effort: Pulmonary effort is normal.     Breath sounds: Normal breath sounds.  Skin:    General: Skin is warm and dry.     Capillary Refill: Capillary refill takes less than 2 seconds.  Neurological:     General: No focal deficit present.     Mental Status: She is alert and oriented to person, place, and time.  Psychiatric:        Mood and Affect: Mood normal.        Behavior: Behavior normal.        Thought Content: Thought content normal.        Judgment: Judgment normal.     Comments: Appropriately tearful for situation       MDM: Moderate Reviewed chart. Trended Hcg today with appropriate decrease considered bleeding.   MAU Course:  A Miscarriage - Plan: Discharge patient  Medical screening exam complete Decrease in hCG from 10/23 (54) to 20 today, appropriate decrease, in relation to bleeding.   P Discharge from MAU in stable condition with routine  precautions. Follow up at [facility] as scheduled for trending beta HcG.  Would expect continued bleeding and an appropriate drop in HcG. Consider following to 0 vs. expectant management and ultrasound in a 2-4 weeks.   Allergies as of 09/23/2024       Reactions   Latex Dermatitis        Medication List     TAKE these medications    acetaminophen 500 MG tablet Commonly known as: TYLENOL Take 500 mg by mouth every 6 (six) hours as needed. For pain   albuterol  108 (90 Base) MCG/ACT inhaler Commonly known as: VENTOLIN  HFA Inhale 1-2 puffs into the lungs every 6 (six) hours as needed for wheezing or shortness of breath.   FLUoxetine 40 MG capsule Commonly known as: PROZAC Take 40 mg by mouth every morning.        [name] Rote, MSN, CNM 09/23/2024 11:29 AM  Certified Nurse Midwife, [facility]

## 2024-09-23 NOTE — MAU Note (Signed)
 Maureen Baxter is a 23 y.o. at [redacted]w[redacted]d here in MAU reporting: reports an increase in bleeding and cramping on Thursday and Friday that has slowed down today. States she is not cramping as much today. Took advil last night but none today. Denies any N/V/D.  Pain score: 2 Vitals:   09/23/24 0946  BP: 128/75  Pulse: 77  Resp: 18  Temp: 98.7 F (37.1 C)  SpO2: 100%      Lab orders placed from triage:  bhcg

## 2024-09-25 ENCOUNTER — Encounter: Payer: Self-pay | Admitting: Family Medicine

## 2024-09-25 ENCOUNTER — Other Ambulatory Visit: Payer: Self-pay

## 2024-09-25 DIAGNOSIS — O039 Complete or unspecified spontaneous abortion without complication: Secondary | ICD-10-CM

## 2024-09-26 ENCOUNTER — Telehealth: Payer: Self-pay | Admitting: Family Medicine

## 2024-09-26 ENCOUNTER — Ambulatory Visit: Payer: Self-pay | Admitting: Certified Nurse Midwife

## 2024-09-26 LAB — BETA HCG QUANT (REF LAB): hCG Quant: 7 m[IU]/mL

## 2024-09-26 NOTE — Telephone Encounter (Signed)
 Called patient and left detailed message about upcoming appointment. Left office number for patient to call if she is unable to make the appointment or need to reschedule.

## 2024-10-10 ENCOUNTER — Other Ambulatory Visit: Payer: Self-pay

## 2024-10-10 ENCOUNTER — Encounter: Payer: Self-pay | Admitting: Physician Assistant

## 2024-10-10 ENCOUNTER — Ambulatory Visit (INDEPENDENT_AMBULATORY_CARE_PROVIDER_SITE_OTHER): Admitting: Physician Assistant

## 2024-10-10 ENCOUNTER — Telehealth: Payer: Self-pay | Admitting: Clinical

## 2024-10-10 VITALS — BP 120/89 | HR 87 | Wt 134.3 lb

## 2024-10-10 DIAGNOSIS — Z30011 Encounter for initial prescription of contraceptive pills: Secondary | ICD-10-CM | POA: Diagnosis not present

## 2024-10-10 DIAGNOSIS — O039 Complete or unspecified spontaneous abortion without complication: Secondary | ICD-10-CM

## 2024-10-10 DIAGNOSIS — Z3A09 9 weeks gestation of pregnancy: Secondary | ICD-10-CM | POA: Diagnosis not present

## 2024-10-10 DIAGNOSIS — Z1331 Encounter for screening for depression: Secondary | ICD-10-CM

## 2024-10-10 NOTE — Telephone Encounter (Signed)
Attempt call regarding referral; Left HIPPA-compliant message to call back Mikle Sternberg from [facility] at  [phone] (Ashelyn Mccravy's office).    

## 2024-10-10 NOTE — Progress Notes (Signed)
 GYNECOLOGY  VISIT   HPI: Maureen Baxter is a 23 y.o.  single  female  G1P0 here for follow-up SAB. Patient presented to MAU 09/20/2024 for cramping and vaginal bleeding after positive at-home pregnancy test.  Serum hCG was elevated with no evidence of intrauterine or extrauterine gestation on ultrasound. HCG decreased to 20 three days afterwards.  Today she is physically well without further vaginal bleeding, cramping, or pregnancy symptoms.  She is interested in [facility] resources for she and her partner.  She would like to resume OCP regimen.  She denies fever, chills, abdominal pain, abnormal vaginal discharge.   GYNECOLOGIC HISTORY: Patient's last menstrual period was 07/19/2024 (approximate). Contraception: none.   Menopausal hormone therapy: Premenopausal Last mammogram: Never done due to age Last pap smear: 05/04/2024-NILM        OB History     Gravida  1   Para      Term      Preterm      AB      Living         SAB      IAB      Ectopic      Multiple      Live Births                 Patient Active Problem List   Diagnosis Date Noted   Iron deficiency 07/11/2023   ASCUS of cervix with negative high risk HPV 07/17/2022   Adjustment disorder with mixed anxiety and depressed mood 02/02/2022   Premenstrual syndrome 02/02/2022   Dermatitis 05/30/2021   Tinea pedis of both feet 05/30/2021   Depression 09/13/2017   Generalized anxiety disorder 09/13/2017   Palpitations 08/27/2017    Past Medical History:  Diagnosis Date   Seasonal allergies     Past Surgical History:  Procedure Laterality Date   TONSILLECTOMY      Current Outpatient Medications  Medication Sig Dispense Refill   albuterol  (VENTOLIN  HFA) 108 (90 Base) MCG/ACT inhaler Inhale 1-2 puffs into the lungs every 6 (six) hours as needed for wheezing or shortness of breath. 18 g 0   FLUoxetine (PROZAC) 40 MG capsule Take 40 mg by mouth every morning.     norethindrone-ethinyl estradiol (BALZIVA)  0.4-35 MG-MCG tablet Take 1 tablet by mouth daily. 28 tablet 11   acetaminophen (TYLENOL) 500 MG tablet Take 500 mg by mouth every 6 (six) hours as needed. For pain (Patient not taking: Reported on 10/10/2024)     No current facility-administered medications for this visit.     ALLERGIES: Latex  Family History  Problem Relation Age of Onset   Healthy Father     Social History   Socioeconomic History   Marital status: Single    Spouse name: Not on file   Number of children: Not on file   Years of education: Not on file   Highest education level: Not on file  Occupational History   Not on file  Tobacco Use   Smoking status: Never   Smokeless tobacco: Never  Vaping Use   Vaping status: Never Used  Substance and Sexual Activity   Alcohol use: Yes    Comment: Occassionally.   Drug use: Not Currently    Types: Marijuana   Sexual activity: Yes    Birth control/protection: I.U.D.  Other Topics Concern   Not on file  Social History Narrative   Not on file   Social Drivers of Health   Financial Resource Strain: Not on file  Food Insecurity: Food Insecurity Present (10/10/2024)   Hunger Vital Sign    Worried About Running Out of Food in the Last Year: Sometimes true    Ran Out of Food in the Last Year: Sometimes true  Transportation Needs: No Transportation Needs (10/10/2024)   PRAPARE - Administrator, Civil Service (Medical): No    Lack of Transportation (Non-Medical): No  Physical Activity: Not on file  Stress: Not on file  Social Connections: Not on file  Intimate Partner Violence: Not on file    Review of Systems  PHYSICAL EXAMINATION:    BP 120/89   Pulse 87   Wt 134 lb 4.8 oz (60.9 kg)   LMP 07/19/2024 (Approximate)   BMI 26.23 kg/m     General appearance: alert, cooperative and appears stated age Head: Normocephalic, without obvious abnormality, atraumatic Lungs: clear to auscultation bilaterally Abdomen: soft, non-tender, no masses,  no  organomegaly Skin: Skin color, texture, turgor normal. No rashes or lesions Neurologic: Grossly normal  ASSESSMENT & PLAN   1. Spontaneous abortion (Primary) Patient well-appearing and in NAD, without signs of systemic or local infection.  Will trend hCG to 0.  - Beta hCG quant (ref lab)  2. Positive depression screening - Ambulatory referral to Integrated Behavioral Health  3. Encounter for initial prescription of contraceptive pills Patient was screened for contraindications to hormonal contraception including estrogen and progesterone; none were found by her report or on review of EMR. - norethindrone-ethinyl estradiol (BALZIVA) 0.4-35 MG-MCG tablet; Take 1 tablet by mouth daily.  Dispense: 28 tablet; Refill: 11   An After Visit Summary was printed and given to the patient.  Avanish Cerullo E Gorden Stthomas, PA-C 11/15/20251:52 PM

## 2024-10-11 LAB — BETA HCG QUANT (REF LAB): hCG Quant: <1 m[IU]/mL

## 2024-10-12 ENCOUNTER — Ambulatory Visit: Payer: Self-pay | Admitting: Physician Assistant

## 2024-10-14 ENCOUNTER — Encounter: Payer: Self-pay | Admitting: Physician Assistant

## 2024-10-14 MED ORDER — BALZIVA 0.4-35 MG-MCG PO TABS: 1.0000 | ORAL_TABLET | Freq: Every day | ORAL | 11 refills | Status: DC

## 2024-10-31 NOTE — BH Specialist Note (Signed)
 Integrated Behavioral Health via Telemedicine Visit  11/03/2024 Maureen Baxter [id number]  Number of Integrated Behavioral Health Clinician visits: 1- Initial Visit  Session Start time: 1526   Session End time: 1610  Total time in minutes: 44  Referring Provider: Jorene Moats, PA-C Patient/Family location: Home [facility] Provider location: [facility]  All persons participating in visit: Patient Maureen Baxter and [facility] Lorma Heater   Types of Service: Individual psychotherapy and Video visit  I connected with Sydnee Sprung and/or Sydnee Passer n/a via  Telephone or Video Enabled Telemedicine Application  (Video is Caregility application) and verified that I am speaking with the correct person using two identifiers. Discussed confidentiality: Yes   I discussed the limitations of telemedicine and the availability of in person appointments.  Discussed there is a possibility of technology failure and discussed alternative modes of communication if that failure occurs.  I discussed that engaging in this telemedicine visit, they consent to the provision of behavioral healthcare and the services will be billed under their insurance.  Patient and/or legal guardian expressed understanding and consented to Telemedicine visit: Yes   Presenting Concerns: Patient and/or family reports the following symptoms/concerns: Depression, mood instability, panic attacks, intrusive thoughts, nightmares, brain fog, anxiety; attributes partially to hormonal changes post-miscarriage; starting a new birth control and first period back postpartum the same week. Pt coped best on a lower dosage of Prozac (20 instead of 40mg ); requesting refills at lower dose; open to referral to psychiatry and self-coping strategy today.  Duration of problem: Increase postpartum; Severity of problem: moderately severe  Patient and/or Family's Strengths/Protective Factors: Social  connections, Concrete supports in place (healthy food, safe environments, etc.), and Sense of purpose  Goals Addressed: Patient will:  Reduce symptoms of: anxiety, depression, and mood instability   Increase knowledge and/or ability of: healthy habits and self-management skills   Demonstrate ability to: Increase healthy adjustment to current life circumstances, Increase adequate support systems for patient/family, and Increase motivation to adhere to plan of care  Progress towards Goals: Ongoing    Interventions: Interventions utilized:  CBT Cognitive Behavioral Therapy, Psychoeducation and/or Health Education, Link to [employer], and Supportive Reflection Standardized Assessments completed: Not Needed  Patient and/or Family Response: Patient agrees with treatment plan.  Clinical Assessment/Diagnosis  Generalized anxiety disorder  Grief  Adjustment disorder with depressed mood   Patient may benefit from psychoeducation and brief therapeutic interventions regarding coping with symptoms of depression and anxiety   Plan: Follow up with behavioral health clinician on : Two weeks Behavioral recommendations:  -Continue taking Prozac as prescribed; medical provider has been made aware of need for refills -Accept referral to psychiatry for [facility] medication management and ongoing therapy -Begin Worry Time strategy, as discussed. Start by setting up start and end time reminders on phone today; continue daily for two weeks. -Continue prioritizing healthy self-care (regular meals, adequate rest; allowing practical help from supportive people in life)  -Consider pregnancy loss support group as needed at either [email or web address] or [email or web address]    Referral(s): Integrated Art Gallery Manager (In Clinic), Community Resources:  Pregnancy loss support, and Psychiatrist  I discussed the assessment and treatment plan with the patient and/or parent/guardian. They were  provided an opportunity to ask questions and all were answered. They agreed with the plan and demonstrated an understanding of the instructions.   They were advised to call back or seek an in-person evaluation if the symptoms worsen or if the condition fails to improve  as anticipated.  Warren JAYSON Mering, LCSW     10/10/2024    3:23 PM  Depression screen PHQ 2/9  Decreased Interest 1  Down, Depressed, Hopeless 2  PHQ - 2 Score 3  Altered sleeping 2  Tired, decreased energy 2  Change in appetite 1  Feeling bad or failure about yourself  3  Trouble concentrating 2  Moving slowly or fidgety/restless 0  Suicidal thoughts 2  PHQ-9 Score 15      10/10/2024    3:24 PM  GAD 7 : Generalized Anxiety Score  Nervous, Anxious, on Edge 1  Control/stop worrying 1  Worry too much - different things 2  Trouble relaxing 2  Restless 0  Easily annoyed or irritable 2  Afraid - awful might happen 2  Total GAD 7 Score 10

## 2024-11-02 ENCOUNTER — Ambulatory Visit (INDEPENDENT_AMBULATORY_CARE_PROVIDER_SITE_OTHER): Payer: Self-pay | Admitting: Clinical

## 2024-11-02 DIAGNOSIS — F411 Generalized anxiety disorder: Secondary | ICD-10-CM

## 2024-11-02 DIAGNOSIS — F4321 Adjustment disorder with depressed mood: Secondary | ICD-10-CM

## 2024-11-03 DIAGNOSIS — Z23 Encounter for immunization: Secondary | ICD-10-CM | POA: Diagnosis not present

## 2024-11-03 NOTE — Patient Instructions (Signed)
[facility] [address] , [place] [address] [phone] (main office) [phone] (Brianne Maina's office)  Pregnancy Loss Support Groups [email or web address] 

## 2024-11-17 NOTE — BH Specialist Note (Unsigned)
"   Pt did not arrive to video visit and did not answer the phone; Left HIPPA-compliant message to call back Warren from [employer] for [employer] at [facility] at  [phone] [facility] office).  ; left MyChart message for patient.   "

## 2024-11-20 ENCOUNTER — Ambulatory Visit: Admitting: Clinical

## 2024-11-20 DIAGNOSIS — Z91199 Patient's noncompliance with other medical treatment and regimen due to unspecified reason: Secondary | ICD-10-CM

## 2024-12-06 ENCOUNTER — Ambulatory Visit (INDEPENDENT_AMBULATORY_CARE_PROVIDER_SITE_OTHER): Payer: Self-pay | Admitting: Adult Health

## 2024-12-06 ENCOUNTER — Encounter: Payer: Self-pay | Admitting: Adult Health

## 2024-12-06 ENCOUNTER — Other Ambulatory Visit: Payer: Self-pay

## 2024-12-06 VITALS — BP 110/80 | HR 100 | Temp 98.2°F | Ht 60.0 in | Wt 132.0 lb

## 2024-12-06 DIAGNOSIS — Z309 Encounter for contraceptive management, unspecified: Secondary | ICD-10-CM

## 2024-12-06 MED ORDER — DROSPIRENONE-ETHINYL ESTRADIOL 3-0.02 MG PO TABS: 1.0000 | ORAL_TABLET | Freq: Every day | ORAL | 0 refills | Status: DC

## 2024-12-06 NOTE — Progress Notes (Signed)
 Therapist, [facility] [address] [place], [place] [address]   Office Visit Note  Patient Name: Maureen Baxter Date of Birth [birth year]  Medical Record number [id number]  Date of Service: 12/06/2024  Chief Complaint  Patient presents with   Medication Refill    Patient would like to discuss switching her BC pill to a different brand d/t vaginal dryness.     HPI Pt is here reporting she recently started her 3rd month back on birth control post pregnancy/miscarriage.  She is complaining of vaginal dryness, and that has been going on since she started this birth control initially.  Mom has had good luck with Yaz, so she would like to try that.    Current Medication:  Outpatient Encounter Medications as of 12/06/2024  Medication Sig   acetaminophen (TYLENOL) 500 MG tablet Take 500 mg by mouth every 6 (six) hours as needed. For pain   albuterol  (VENTOLIN  HFA) 108 (90 Base) MCG/ACT inhaler Inhale 1-2 puffs into the lungs every 6 (six) hours as needed for wheezing or shortness of breath.   drospirenone -ethinyl estradiol  (YAZ) 3-0.02 MG tablet Take 1 tablet by mouth daily.   [DISCONTINUED] norethindrone-ethinyl estradiol  (BALZIVA ) 0.4-35 MG-MCG tablet Take 1 tablet by mouth daily.   [DISCONTINUED] FLUoxetine (PROZAC) 40 MG capsule Take 40 mg by mouth every morning.   No facility-administered encounter medications on file as of 12/06/2024.      Medical History: Past Medical History:  Diagnosis Date   Seasonal allergies      Vital Signs: BP 110/80   Pulse 100   Temp 98.2 F (36.8 C)   Ht 5' (1.524 m)   Wt 132 lb (59.9 kg)   LMP 07/19/2024 (Approximate)   SpO2 99%   BMI 25.78 kg/m    Review of Systems  Constitutional:  Negative for activity change, appetite change and fever.  HENT:  Negative for congestion, postnasal drip, sinus pain and sore throat.   Eyes:  Negative for pain, discharge and itching.  Respiratory:  Negative for cough and shortness of breath.    Cardiovascular:  Negative for chest pain, palpitations and leg swelling.  Gastrointestinal:  Negative for abdominal distention and abdominal pain.  Endocrine: Negative for cold intolerance and heat intolerance.  Genitourinary:  Negative for difficulty urinating and flank pain.  Musculoskeletal:  Negative for arthralgias and joint swelling.  Skin:  Negative for color change and rash.  Neurological:  Negative for dizziness and headaches.  Hematological:  Negative for adenopathy.  Psychiatric/Behavioral:  Negative for agitation and confusion.     Physical Exam Vitals reviewed.  Constitutional:      Appearance: Normal appearance.  Neurological:     Mental Status: She is alert.    Assessment/Plan: 1. Encounter for contraceptive management, unspecified type (Primary) Take Yaz as discussed. Referral for GYN as requested to establish care. Follow up via MyChart messenger if symptoms fail to improve or may return to clinic as needed for worsening symptoms.   - drospirenone -ethinyl estradiol  (YAZ) 3-0.02 MG tablet; Take 1 tablet by mouth daily.  Dispense: 84 tablet; Refill: 0 - Ambulatory referral to Obstetrics / Gynecology        General Counseling: Salma verbalizes understanding of the findings of todays visit and agrees with plan of treatment. I have discussed any further diagnostic evaluation that may be needed or ordered today. We also reviewed her medications today. she has been encouraged to call the office with any questions or concerns that should arise related to todays visit.  Orders Placed This Encounter  Procedures   Ambulatory referral to Obstetrics / Gynecology    Meds ordered this encounter  Medications   drospirenone -ethinyl estradiol  (YAZ) 3-0.02 MG tablet    Sig: Take 1 tablet by mouth daily.    Dispense:  84 tablet    Refill:  0    Time spent:15 Minutes    Juliene DOROTHA Howells AGNP-C Nurse Practitioner

## 2025-01-04 ENCOUNTER — Ambulatory Visit: Admitting: Adult Health

## 2025-01-05 ENCOUNTER — Other Ambulatory Visit: Payer: Self-pay

## 2025-01-05 ENCOUNTER — Encounter: Payer: Self-pay | Admitting: Adult Health

## 2025-01-05 ENCOUNTER — Ambulatory Visit: Admitting: Adult Health

## 2025-01-05 VITALS — BP 120/74 | HR 100 | Temp 97.5°F | Ht 60.0 in | Wt 135.0 lb

## 2025-01-05 DIAGNOSIS — F411 Generalized anxiety disorder: Secondary | ICD-10-CM

## 2025-01-05 DIAGNOSIS — R300 Dysuria: Secondary | ICD-10-CM

## 2025-01-05 DIAGNOSIS — B3731 Acute candidiasis of vulva and vagina: Secondary | ICD-10-CM

## 2025-01-05 LAB — POCT URINALYSIS DIPSTICK (MANUAL)
Nitrite, UA: NEGATIVE
Poct Bilirubin: NEGATIVE
Poct Blood: 50 — AB
Poct Glucose: NORMAL mg/dL
Poct Ketones: NEGATIVE
Poct Protein: NEGATIVE mg/dL
Poct Urobilinogen: NORMAL mg/dL
Spec Grav, UA: 1.015
pH, UA: 6

## 2025-01-05 MED ORDER — FLUCONAZOLE 150 MG PO TABS: ORAL_TABLET | ORAL | 0 refills | Status: AC

## 2025-01-05 MED ORDER — ALPRAZOLAM 0.25 MG PO TABS: 0.2500 mg | ORAL_TABLET | Freq: Two times a day (BID) | ORAL | 0 refills | Status: AC | PRN

## 2025-01-05 NOTE — Progress Notes (Signed)
 Therapist, [facility] [address] [place], [place] [address]   Office Visit Note  Patient Name: Maureen Baxter Date of Birth [birth year]  Medical Record number [id number]  Date of Service: 01/05/2025  Chief Complaint  Patient presents with   Medication Refill    Requesting alprazolam  refill. She is not sure of the dose. Patient does not have initial consult with behavioral health until 01/28/25. Stopped going to [facility] in [place]. Patient also c/o a burning sensation when urinating for the last weeks. She states her urine looks frothy.      Medication Refill Pertinent negatives include no chills, fatigue or fever.   Pt is here for a sick visit. She is complaining of some creamy white vaginal discharge, as well as some burning with urination. She also describes an acid smell, vaginally, that has been present for 2 weeks.  She also reports she is out of her Alprazalam tablets.  She takes them for anxiety/panic symptoms.  She does not take them often, but can not see behavioral health until the end of march, so she is looking for a bridge.    Current Medication:  Outpatient Encounter Medications as of 01/05/2025  Medication Sig   albuterol  (VENTOLIN  HFA) 108 (90 Base) MCG/ACT inhaler Inhale 1-2 puffs into the lungs every 6 (six) hours as needed for wheezing or shortness of breath.   ALPRAZolam  (XANAX ) 0.25 MG tablet Take 1 tablet (0.25 mg total) by mouth 2 (two) times daily as needed for anxiety.   fluconazole  (DIFLUCAN ) 150 MG tablet Take one table by mouth today.  Then may repeat in 5 days if symptoms persist.   [DISCONTINUED] acetaminophen (TYLENOL) 500 MG tablet Take 500 mg by mouth every 6 (six) hours as needed. For pain   [DISCONTINUED] drospirenone -ethinyl estradiol  (YAZ) 3-0.02 MG tablet Take 1 tablet by mouth daily.   No facility-administered encounter medications on file as of 01/05/2025.      Medical History: Past Medical History:  Diagnosis Date   Seasonal  allergies      Vital Signs: BP 120/74   Pulse 100   Temp (!) 97.5 F (36.4 C)   Ht 5' (1.524 m)   Wt 135 lb (61.2 kg)   LMP 07/19/2024 (Approximate)   SpO2 99%   BMI 26.37 kg/m    Review of Systems  Constitutional:  Negative for chills, fatigue and fever.  Genitourinary:  Positive for vaginal discharge.  Psychiatric/Behavioral:  Negative for confusion. The patient is nervous/anxious. The patient is not hyperactive.     Physical Exam Vitals reviewed.  Constitutional:      General: She is not in acute distress.    Appearance: She is normal weight. She is not ill-appearing.  HENT:     Head: Normocephalic.     Right Ear: Tympanic membrane and ear canal normal.     Left Ear: Tympanic membrane and ear canal normal.     Nose: Nose normal.     Mouth/Throat:     Mouth: Mucous membranes are moist.  Eyes:     General:        Right eye: No discharge.        Left eye: No discharge.     Pupils: Pupils are equal, round, and reactive to light.  Cardiovascular:     Rate and Rhythm: Normal rate and regular rhythm.     Pulses: Normal pulses.     Heart sounds: No murmur heard.    No friction rub.  Pulmonary:  Effort: Pulmonary effort is normal. No respiratory distress.     Breath sounds: Normal breath sounds. No wheezing.  Musculoskeletal:     Cervical back: Normal range of motion. No rigidity.  Lymphadenopathy:     Cervical: No cervical adenopathy.  Neurological:     General: No focal deficit present.     Mental Status: She is alert and oriented to person, place, and time.  Psychiatric:        Mood and Affect: Mood normal.        Behavior: Behavior normal.       Assessment/Plan: 1. Vaginal candida (Primary) Take diflucan  as discussed.  She will message if symptoms persist, and we would treat for UTi at that time.  - POCT Urinalysis Dip Manual - fluconazole  (DIFLUCAN ) 150 MG tablet; Take one table by mouth today.  Then may repeat in 5 days if symptoms persist.   Dispense: 2 tablet; Refill: 0  2. GAD (generalized anxiety disorder) Take as needed, keep appt with behavioral health.  - ALPRAZolam  (XANAX ) 0.25 MG tablet; Take 1 tablet (0.25 mg total) by mouth 2 (two) times daily as needed for anxiety.  Dispense: 10 tablet; Refill: 0     General Counseling: Maureen Baxter verbalizes understanding of the findings of todays visit and agrees with plan of treatment. I have discussed any further diagnostic evaluation that may be needed or ordered today. We also reviewed her medications today. she has been encouraged to call the office with any questions or concerns that should arise related to todays visit.   Orders Placed This Encounter  Procedures   Urine Culture   POCT Urinalysis Dip Manual    Meds ordered this encounter  Medications   fluconazole  (DIFLUCAN ) 150 MG tablet    Sig: Take one table by mouth today.  Then may repeat in 5 days if symptoms persist.    Dispense:  2 tablet    Refill:  0   ALPRAZolam  (XANAX ) 0.25 MG tablet    Sig: Take 1 tablet (0.25 mg total) by mouth 2 (two) times daily as needed for anxiety.    Dispense:  10 tablet    Refill:  0    Time spent:15 Minutes    Juliene DOROTHA Howells AGNP-C Nurse Practitioner

## 2025-01-24 ENCOUNTER — Ambulatory Visit (HOSPITAL_COMMUNITY)

## 2025-01-30 ENCOUNTER — Ambulatory Visit (HOSPITAL_COMMUNITY): Admitting: Family

## 2025-04-18 ENCOUNTER — Ambulatory Visit (HOSPITAL_COMMUNITY)
